# Patient Record
Sex: Male | Born: 1945 | Race: White | Hispanic: No | Marital: Single | Smoking: Current every day smoker
Health system: Southern US, Community
[De-identification: ages and names within clinical notes are randomized; demographics above are authoritative.]

## PROBLEM LIST (undated history)

## (undated) DIAGNOSIS — F329 Major depressive disorder, single episode, unspecified: Secondary | ICD-10-CM

## (undated) DIAGNOSIS — J449 Chronic obstructive pulmonary disease, unspecified: Secondary | ICD-10-CM

## (undated) DIAGNOSIS — E119 Type 2 diabetes mellitus without complications: Secondary | ICD-10-CM

## (undated) DIAGNOSIS — I2510 Atherosclerotic heart disease of native coronary artery without angina pectoris: Secondary | ICD-10-CM

## (undated) DIAGNOSIS — G4730 Sleep apnea, unspecified: Secondary | ICD-10-CM

## (undated) DIAGNOSIS — E785 Hyperlipidemia, unspecified: Secondary | ICD-10-CM

## (undated) DIAGNOSIS — F32A Depression, unspecified: Secondary | ICD-10-CM

## (undated) DIAGNOSIS — M1990 Unspecified osteoarthritis, unspecified site: Secondary | ICD-10-CM

## (undated) DIAGNOSIS — F1910 Other psychoactive substance abuse, uncomplicated: Secondary | ICD-10-CM

## (undated) HISTORY — PX: CORONARY ANGIOPLASTY: SHX604

## (undated) HISTORY — PX: CARDIAC CATHETERIZATION: SHX172

---

## 2008-09-10 ENCOUNTER — Emergency Department: Payer: Self-pay | Admitting: Emergency Medicine

## 2008-09-10 ENCOUNTER — Other Ambulatory Visit: Payer: Self-pay

## 2010-09-06 ENCOUNTER — Emergency Department: Payer: Self-pay | Admitting: Emergency Medicine

## 2014-04-24 ENCOUNTER — Emergency Department: Payer: Self-pay | Admitting: Emergency Medicine

## 2014-04-24 LAB — BASIC METABOLIC PANEL
Anion Gap: 4 — ABNORMAL LOW (ref 7–16)
BUN: 18 mg/dL (ref 7–18)
CHLORIDE: 105 mmol/L (ref 98–107)
CO2: 27 mmol/L (ref 21–32)
CREATININE: 1.23 mg/dL (ref 0.60–1.30)
Calcium, Total: 9.2 mg/dL (ref 8.5–10.1)
EGFR (African American): >60
EGFR (Non-African Amer.): 60 — ABNORMAL LOW
GLUCOSE: 132 mg/dL — AB (ref 65–99)
OSMOLALITY: 276 (ref 275–301)
POTASSIUM: 3.9 mmol/L (ref 3.5–5.1)
Sodium: 136 mmol/L (ref 136–145)

## 2014-04-24 LAB — CBC
HCT: 51.8 % (ref 40.0–52.0)
HGB: 17.6 g/dL (ref 13.0–18.0)
MCH: 31.2 pg (ref 26.0–34.0)
MCHC: 33.9 g/dL (ref 32.0–36.0)
MCV: 92 fL (ref 80–100)
Platelet: 286 10*3/uL (ref 150–440)
RBC: 5.64 10*6/uL (ref 4.40–5.90)
RDW: 13.8 % (ref 11.5–14.5)
WBC: 8.3 10*3/uL (ref 3.8–10.6)

## 2014-04-24 LAB — TROPONIN I: Troponin-I: <0.02 ng/mL

## 2015-07-06 ENCOUNTER — Ambulatory Visit: Payer: Self-pay | Admitting: *Deleted

## 2016-05-01 ENCOUNTER — Encounter: Payer: Self-pay | Admitting: *Deleted

## 2016-05-02 ENCOUNTER — Ambulatory Visit
Admission: RE | Admit: 2016-05-02 | Discharge: 2016-05-02 | Disposition: A | Discharge status: Home Health | Payer: Medicare HMO | Source: Ambulatory Visit | Attending: Gastroenterology | Admitting: Gastroenterology

## 2016-05-02 ENCOUNTER — Ambulatory Visit: Payer: Medicare HMO | Admitting: Anesthesiology

## 2016-05-02 ENCOUNTER — Encounter: Payer: Self-pay | Admitting: *Deleted

## 2016-05-02 ENCOUNTER — Encounter
Admission: RE | Disposition: A | Discharge status: Home Health | Payer: Self-pay | Source: Ambulatory Visit | Attending: Gastroenterology

## 2016-05-02 DIAGNOSIS — Z800 Family history of malignant neoplasm of digestive organs: Secondary | ICD-10-CM | POA: Diagnosis not present

## 2016-05-02 DIAGNOSIS — F329 Major depressive disorder, single episode, unspecified: Secondary | ICD-10-CM | POA: Insufficient documentation

## 2016-05-02 DIAGNOSIS — E119 Type 2 diabetes mellitus without complications: Secondary | ICD-10-CM | POA: Insufficient documentation

## 2016-05-02 DIAGNOSIS — G4730 Sleep apnea, unspecified: Secondary | ICD-10-CM | POA: Insufficient documentation

## 2016-05-02 DIAGNOSIS — Z5309 Procedure and treatment not carried out because of other contraindication: Secondary | ICD-10-CM | POA: Insufficient documentation

## 2016-05-02 DIAGNOSIS — K621 Rectal polyp: Secondary | ICD-10-CM | POA: Insufficient documentation

## 2016-05-02 DIAGNOSIS — F1020 Alcohol dependence, uncomplicated: Secondary | ICD-10-CM | POA: Diagnosis not present

## 2016-05-02 DIAGNOSIS — Z7984 Long term (current) use of oral hypoglycemic drugs: Secondary | ICD-10-CM | POA: Insufficient documentation

## 2016-05-02 DIAGNOSIS — Z79899 Other long term (current) drug therapy: Secondary | ICD-10-CM | POA: Insufficient documentation

## 2016-05-02 DIAGNOSIS — Z7982 Long term (current) use of aspirin: Secondary | ICD-10-CM | POA: Insufficient documentation

## 2016-05-02 DIAGNOSIS — E785 Hyperlipidemia, unspecified: Secondary | ICD-10-CM | POA: Insufficient documentation

## 2016-05-02 DIAGNOSIS — J449 Chronic obstructive pulmonary disease, unspecified: Secondary | ICD-10-CM | POA: Insufficient documentation

## 2016-05-02 DIAGNOSIS — Z1211 Encounter for screening for malignant neoplasm of colon: Secondary | ICD-10-CM | POA: Diagnosis not present

## 2016-05-02 DIAGNOSIS — I2510 Atherosclerotic heart disease of native coronary artery without angina pectoris: Secondary | ICD-10-CM | POA: Insufficient documentation

## 2016-05-02 DIAGNOSIS — M1990 Unspecified osteoarthritis, unspecified site: Secondary | ICD-10-CM | POA: Insufficient documentation

## 2016-05-02 HISTORY — DX: Atherosclerotic heart disease of native coronary artery without angina pectoris: I25.10

## 2016-05-02 HISTORY — DX: Chronic obstructive pulmonary disease, unspecified: J44.9

## 2016-05-02 HISTORY — DX: Major depressive disorder, single episode, unspecified: F32.9

## 2016-05-02 HISTORY — DX: Hyperlipidemia, unspecified: E78.5

## 2016-05-02 HISTORY — DX: Other psychoactive substance abuse, uncomplicated: F19.10

## 2016-05-02 HISTORY — PX: COLONOSCOPY WITH PROPOFOL: SHX5780

## 2016-05-02 HISTORY — DX: Unspecified osteoarthritis, unspecified site: M19.90

## 2016-05-02 HISTORY — DX: Depression, unspecified: F32.A

## 2016-05-02 HISTORY — DX: Sleep apnea, unspecified: G47.30

## 2016-05-02 HISTORY — DX: Type 2 diabetes mellitus without complications: E11.9

## 2016-05-02 SURGERY — COLONOSCOPY WITH PROPOFOL
Anesthesia: General

## 2016-05-02 MED ORDER — FENTANYL CITRATE (PF) 100 MCG/2ML IJ SOLN
INTRAMUSCULAR | Status: DC | PRN
  Administered 2016-05-02: 50 ug via INTRAVENOUS

## 2016-05-02 MED ORDER — SODIUM CHLORIDE 0.9 % IV SOLN
INTRAVENOUS | Status: DC
  Administered 2016-05-02: 12:00:00 via INTRAVENOUS

## 2016-05-02 MED ORDER — MIDAZOLAM HCL 2 MG/2ML IJ SOLN
INTRAMUSCULAR | Status: DC | PRN
Start: 2016-05-02 — End: 2016-05-02
  Administered 2016-05-02: 1 mg via INTRAVENOUS

## 2016-05-02 MED ORDER — PROPOFOL 500 MG/50ML IV EMUL
INTRAVENOUS | Status: DC | PRN
  Administered 2016-05-02: 120 ug/kg/min via INTRAVENOUS

## 2016-05-02 NOTE — Transfer of Care (Signed)
Immediate Anesthesia Transfer of Care Note  Patient: Ross French  Procedure(s) Performed: Procedure(s): COLONOSCOPY WITH PROPOFOL (N/A)  Patient Location: PACU  Anesthesia Type:General  Level of Consciousness: awake, alert  and oriented  Airway & Oxygen Therapy: Patient Spontanous Breathing and Patient connected to nasal cannula oxygen  Post-op Assessment: Report given to RN and Post -op Vital signs reviewed and stable  Post vital signs: Reviewed and stable  Last Vitals:  Filed Vitals:   05/02/16 1311 05/02/16 1354  BP: 126/89 114/71  Pulse: 68   Temp: 37 C 35.7 C  Resp: 20     Last Pain: There were no vitals filed for this visit.       Complications: No apparent anesthesia complications

## 2016-05-02 NOTE — Op Note (Signed)
[facility] Patient Name: Ross French Procedure Date: 05/02/2016 1:35 PM MRN: [id number] Account #: [id number] Date of Birth: Feb 29, 1946 Admit Type: Outpatient Age: 70 Room: [facility] ENDO ROOM 3 Gender: Male Note Status: Finalized Procedure:            Colonoscopy Indications:          Screening in patient at increased risk: Family history                        of 1st-degree relative with colorectal cancer before                        age 71 years, This is the patient's first colonoscopy Providers:            Lollie Sails, MD Referring MD:         No Local Md, MD (Referring MD) Medicines:            Monitored Anesthesia Care Complications:        No immediate complications. Procedure:            Pre-Anesthesia Assessment:                       - ASA Grade Assessment: III - A patient with severe                        systemic disease.                       After obtaining informed consent, the colonoscope was                        passed under direct vision. Throughout the procedure,                        the patient's blood pressure, pulse, and oxygen                        saturations were monitored continuously. The                        Colonoscope was introduced through the anus with the                        intention of advancing to the cecum. The scope was                        advanced to the sigmoid colon before the procedure was                        aborted. Medications were given. The patient tolerated                        the procedure well. The quality of the bowel                        preparation was poor. Findings:      A 3 mm polyp was found in the rectum. The polyp was sessile. The polyp       was removed with a cold biopsy forceps. Resection and retrieval were  complete.      I advanced the scope to about the mid sigmoid colon. There were several       polyps noted however there was a laerge amount of stool  and food residue       that obstructed vision , precluding adequate examination. Other polyps       were not removed due to inadequacy of the prep. Impression:           - Preparation of the colon was poor.                       - One 3 mm polyp in the rectum, removed with a cold                        biopsy forceps. Resected and retrieved. Recommendation:       - Discharge patient to home.                       - repeat prep and reschedule. Procedure Code(s):    --- Professional ---                       [phone], 23, Colonoscopy, flexible; with biopsy, single                        or multiple Diagnosis Code(s):    --- Professional ---                       Z80.0, Family history of malignant neoplasm of                        digestive organs                       K62.1, Rectal polyp CPT copyright 2016 [employer]. All rights reserved. The codes documented in this report are preliminary and upon coder review may  be revised to meet current compliance requirements. Lollie Sails, MD 05/02/2016 1:54:41 PM This report has been signed electronically. Number of Addenda: 0 Note Initiated On: 05/02/2016 1:35 PM Total Procedure Duration: 0 hours 7 minutes 32 seconds       [facility]

## 2016-05-02 NOTE — Anesthesia Procedure Notes (Signed)
Performed by: COOK-MARTIN, Prynce Jacober Pre-anesthesia Checklist: Patient identified, Emergency Drugs available, Suction available, Patient being monitored and Timeout performed Patient Re-evaluated:Patient Re-evaluated prior to inductionOxygen Delivery Method: Nasal cannula Preoxygenation: Pre-oxygenation with 100% oxygen Intubation Type: IV induction Placement Confirmation: positive ETCO2 and CO2 detector       

## 2016-05-02 NOTE — Anesthesia Preprocedure Evaluation (Signed)
Anesthesia Evaluation  Patient identified by MRN, date of birth, ID band Patient awake    Reviewed: Allergy & Precautions, H&P , NPO status , Patient's Chart, lab work & pertinent test results, reviewed documented beta blocker date and time   History of Anesthesia Complications Negative for: history of anesthetic complications  Airway Mallampati: II  TM Distance: >3 FB Neck ROM: full    Dental no notable dental hx. (+) Missing, Poor Dentition   Pulmonary neg shortness of breath, sleep apnea , COPD, neg recent URI, Current Smoker,    Pulmonary exam normal breath sounds clear to auscultation       Cardiovascular Exercise Tolerance: Good (-) hypertension(-) angina+ CAD and + Cardiac Stents (x3)  (-) Past MI and (-) CABG Normal cardiovascular exam(-) dysrhythmias (-) Valvular Problems/Murmurs Rhythm:regular Rate:Normal     Neuro/Psych PSYCHIATRIC DISORDERS (Depression) negative neurological ROS     GI/Hepatic negative GI ROS, Neg liver ROS,   Endo/Other  diabetes  Renal/GU negative Renal ROS  negative genitourinary   Musculoskeletal   Abdominal   Peds  Hematology negative hematology ROS (+)   Anesthesia Other Findings Past Medical History:   Arthritis                                                    Coronary artery disease                                      COPD (chronic obstructive pulmonary disease) (*              Depression                                                   Hyperlipidemia                                               Sleep apnea                                                  Diabetes mellitus without complication (HCC)                 Substance abuse                                 ETOH Addi*   Reproductive/Obstetrics negative OB ROS                             Anesthesia Physical Anesthesia Plan  ASA: III  Anesthesia Plan: General   Post-op Pain Management:     Induction:   Airway Management Planned:   Additional Equipment:   Intra-op Plan:   Post-operative Plan:   Informed Consent: I have reviewed the patients History and Physical,  chart, labs and discussed the procedure including the risks, benefits and alternatives for the proposed anesthesia with the patient or authorized representative who has indicated his/her understanding and acceptance.   Dental Advisory Given  Plan Discussed with: Anesthesiologist, CRNA and Surgeon  Anesthesia Plan Comments:         Anesthesia Quick Evaluation

## 2016-05-02 NOTE — H&P (Signed)
Outpatient short stay form Pre-procedure 05/02/2016 1:18 PM Lollie Sails MD  Primary Physician: Dr. Kirkland Hun  Reason for visit:  Colonoscopy  History of present illness:  Patient is a 70 year old male with a family history of colon cancer in a primary relative. He is presenting today for colonoscopy. He does take both Plavix and a baby aspirin daily however stopped those last Wednesday. He denies any other aspirin products or blood thinning agents. He tolerated his prep well however did eat some solid foods yesterday. There is a family history of colon cancer in primary relatives, father.    Current facility-administered medications:  .  0.9 %  sodium chloride infusion, , Intravenous, Continuous, Lollie Sails, MD  Prescriptions prior to admission  Medication Sig Dispense Refill Last Dose  . aspirin EC 81 MG tablet Take 81 mg by mouth daily.   Past Week at Unknown time  . atorvastatin (LIPITOR) 80 MG tablet Take 80 mg by mouth daily.   05/01/2016 at Unknown time  . Cholecalciferol (VITAMIN D-3 PO) Take 2,000 Units by mouth daily.   05/01/2016 at Unknown time  . clopidogrel (PLAVIX) 75 MG tablet Take 75 mg by mouth daily.   Past Week at Unknown time  . Ipratropium-Albuterol (COMBIVENT) 20-100 MCG/ACT AERS respimat Inhale 1 puff into the lungs every 4 (four) hours as needed for wheezing.   05/01/2016 at Unknown time  . Levomefolate Calcium POWD 1 tablet by Does not apply route daily.   05/01/2016 at Unknown time  . metFORMIN (GLUCOPHAGE-XR) 500 MG 24 hr tablet Take 500 mg by mouth daily with supper.   05/01/2016 at Unknown time  . metoprolol succinate (TOPROL-XL) 25 MG 24 hr tablet Take 25 mg by mouth daily.   05/02/2016 at 0630  . PARoxetine (PAXIL) 20 MG tablet Take 20 mg by mouth daily.   05/02/2016 at 0630  . sildenafil (VIAGRA) 25 MG tablet Take 25 mg by mouth daily as needed for erectile dysfunction.   Past Week at Unknown time     No Known Allergies   Past Medical History   Diagnosis Date  . Arthritis   . Coronary artery disease   . COPD (chronic obstructive pulmonary disease) ([place])   . Depression   . Hyperlipidemia   . Sleep apnea   . Diabetes mellitus without complication ([place])   . Substance abuse ETOH Addiction    Review of systems:      Physical Exam    Heart and lungs: Regular rate and rhythm without rub or gallop, lungs are bilaterally clear.    HEENT: Normocephalic atraumatic eyes are anicteric    Other:     Pertinant exam for procedure: Soft nontender nondistended bowel sounds positive normoactive.    Planned proceedures: Colonoscopy and indicated procedures. I have discussed the risks benefits and complications of procedures to include not limited to bleeding, infection, perforation and the risk of sedation and the patient wishes to proceed.    Lollie Sails, MD Gastroenterology 05/02/2016  1:18 PM

## 2016-05-03 ENCOUNTER — Ambulatory Visit
Admission: RE | Admit: 2016-05-03 | Discharge: 2016-05-03 | Disposition: A | Discharge status: Home Health | Payer: Medicare HMO | Source: Ambulatory Visit | Attending: Gastroenterology | Admitting: Gastroenterology

## 2016-05-03 ENCOUNTER — Encounter
Admission: RE | Disposition: A | Discharge status: Home Health | Payer: Self-pay | Source: Ambulatory Visit | Attending: Gastroenterology

## 2016-05-03 ENCOUNTER — Encounter: Payer: Self-pay | Admitting: Gastroenterology

## 2016-05-03 ENCOUNTER — Ambulatory Visit: Payer: Medicare HMO | Admitting: Anesthesiology

## 2016-05-03 DIAGNOSIS — K621 Rectal polyp: Secondary | ICD-10-CM | POA: Diagnosis not present

## 2016-05-03 DIAGNOSIS — D125 Benign neoplasm of sigmoid colon: Secondary | ICD-10-CM | POA: Insufficient documentation

## 2016-05-03 DIAGNOSIS — F17210 Nicotine dependence, cigarettes, uncomplicated: Secondary | ICD-10-CM | POA: Diagnosis not present

## 2016-05-03 DIAGNOSIS — E785 Hyperlipidemia, unspecified: Secondary | ICD-10-CM | POA: Diagnosis not present

## 2016-05-03 DIAGNOSIS — Z800 Family history of malignant neoplasm of digestive organs: Secondary | ICD-10-CM | POA: Diagnosis not present

## 2016-05-03 DIAGNOSIS — Z7982 Long term (current) use of aspirin: Secondary | ICD-10-CM | POA: Diagnosis not present

## 2016-05-03 DIAGNOSIS — E119 Type 2 diabetes mellitus without complications: Secondary | ICD-10-CM | POA: Insufficient documentation

## 2016-05-03 DIAGNOSIS — I2510 Atherosclerotic heart disease of native coronary artery without angina pectoris: Secondary | ICD-10-CM | POA: Diagnosis not present

## 2016-05-03 DIAGNOSIS — J449 Chronic obstructive pulmonary disease, unspecified: Secondary | ICD-10-CM | POA: Diagnosis not present

## 2016-05-03 DIAGNOSIS — Z79899 Other long term (current) drug therapy: Secondary | ICD-10-CM | POA: Diagnosis not present

## 2016-05-03 DIAGNOSIS — Z7984 Long term (current) use of oral hypoglycemic drugs: Secondary | ICD-10-CM | POA: Insufficient documentation

## 2016-05-03 DIAGNOSIS — D123 Benign neoplasm of transverse colon: Secondary | ICD-10-CM | POA: Insufficient documentation

## 2016-05-03 DIAGNOSIS — G4730 Sleep apnea, unspecified: Secondary | ICD-10-CM | POA: Diagnosis not present

## 2016-05-03 DIAGNOSIS — D120 Benign neoplasm of cecum: Secondary | ICD-10-CM | POA: Insufficient documentation

## 2016-05-03 DIAGNOSIS — K5730 Diverticulosis of large intestine without perforation or abscess without bleeding: Secondary | ICD-10-CM | POA: Diagnosis not present

## 2016-05-03 DIAGNOSIS — M1990 Unspecified osteoarthritis, unspecified site: Secondary | ICD-10-CM | POA: Diagnosis not present

## 2016-05-03 DIAGNOSIS — D124 Benign neoplasm of descending colon: Secondary | ICD-10-CM | POA: Diagnosis not present

## 2016-05-03 DIAGNOSIS — F329 Major depressive disorder, single episode, unspecified: Secondary | ICD-10-CM | POA: Diagnosis not present

## 2016-05-03 DIAGNOSIS — Z1211 Encounter for screening for malignant neoplasm of colon: Secondary | ICD-10-CM | POA: Diagnosis present

## 2016-05-03 HISTORY — PX: COLONOSCOPY WITH PROPOFOL: SHX5780

## 2016-05-03 LAB — SURGICAL PATHOLOGY

## 2016-05-03 LAB — GLUCOSE, CAPILLARY: Glucose-Capillary: 112 mg/dL — ABNORMAL HIGH (ref 65–99)

## 2016-05-03 SURGERY — COLONOSCOPY WITH PROPOFOL
Anesthesia: General

## 2016-05-03 MED ORDER — PROPOFOL 500 MG/50ML IV EMUL
INTRAVENOUS | Status: DC | PRN
  Administered 2016-05-03: 120 ug/kg/min via INTRAVENOUS

## 2016-05-03 MED ORDER — SODIUM CHLORIDE 0.9 % IV SOLN: INTRAVENOUS | Status: DC

## 2016-05-03 MED ORDER — SODIUM CHLORIDE 0.9 % IV SOLN
INTRAVENOUS | Status: DC
Start: 2016-05-03 — End: 2016-05-03
  Administered 2016-05-03: 14:00:00 via INTRAVENOUS

## 2016-05-03 MED ORDER — PROPOFOL 10 MG/ML IV BOLUS
INTRAVENOUS | Status: DC | PRN
  Administered 2016-05-03 (×2): 10 mg via INTRAVENOUS
  Administered 2016-05-03: 20 mg via INTRAVENOUS

## 2016-05-03 MED ORDER — EPHEDRINE SULFATE 50 MG/ML IJ SOLN
INTRAMUSCULAR | Status: DC | PRN
  Administered 2016-05-03 (×2): 10 mg via INTRAVENOUS

## 2016-05-03 MED ORDER — FENTANYL CITRATE (PF) 100 MCG/2ML IJ SOLN
INTRAMUSCULAR | Status: DC | PRN
  Administered 2016-05-03: 50 ug via INTRAVENOUS

## 2016-05-03 MED ORDER — MIDAZOLAM HCL 2 MG/2ML IJ SOLN
INTRAMUSCULAR | Status: DC | PRN
  Administered 2016-05-03: 1 mg via INTRAVENOUS

## 2016-05-03 NOTE — Op Note (Signed)
[facility] Patient Name: Ross French Procedure Date: 05/03/2016 2:19 PM MRN: [id number] Account #: [id number] Date of Birth: [birth year] Admit Type: Outpatient Age: 70 Room: [facility] ENDO ROOM 4 Gender: Male Note Status: Finalized Procedure:            Colonoscopy Indications:          This is the patient's first colonoscopy, Family history                        of colon cancer in a first-degree relative Providers:            Lollie Sails, MD Referring MD:         No Local Md, MD (Referring MD) Medicines:            Monitored Anesthesia Care Complications:        No immediate complications. Procedure:            Pre-Anesthesia Assessment:                       - ASA Grade Assessment: III - A patient with severe                        systemic disease.                       After obtaining informed consent, the colonoscope was                        passed under direct vision. Throughout the procedure,                        the patient's blood pressure, pulse, and oxygen                        saturations were monitored continuously. The                        Colonoscope was introduced through the anus and                        advanced to the the cecum, identified by appendiceal                        orifice and ileocecal valve. The colonoscopy was                        unusually difficult due to a tortuous colon. Successful                        completion of the procedure was aided by changing the                        patient to a supine position and using manual pressure.                        The patient tolerated the procedure well. The quality                        of the bowel preparation was fair. Findings:      A 10 mm  polyp was found in the cecum. The polyp was multi-lobulated and       sessile. The polyp was removed with a cold snare. Resection and       retrieval were complete. To prevent bleeding after the polypectomy,  one       hemostatic clip was successfully placed. There was no bleeding at the       end of the maneuver.      A 1 mm polyp was found in the cecum. The polyp was sessile. The polyp       was removed with a cold biopsy forceps. Resection and retrieval were       complete.      A 5 mm polyp was found in the hepatic flexure. The polyp was sessile.       The polyp was removed with a cold snare. Resection and retrieval were       complete.      A 5 mm polyp was found in the proximal transverse colon. The polyp was       sessile. The polyp was removed with a cold snare. Resection and       retrieval were complete.      A 16 mm polyp was found in the mid transverse colon. The polyp was flat.       The polyp was removed with a cold snare. Resection and retrieval were       complete. To prevent bleeding after the polypectomy, one hemostatic clip       was successfully placed. There was no bleeding at the end of the       maneuver.      A 4 mm polyp was found in the distal transverse colon. The polyp was       sessile. The polyp was removed with a cold snare. Resection and       retrieval were complete.      A 2 mm polyp was found in the descending colon. The polyp was sessile.       The polyp was removed with a cold biopsy forceps. Resection and       retrieval were complete.      A 4 mm polyp was found in the mid sigmoid colon. The polyp was sessile.       The polyp was removed with a cold snare. Resection and retrieval were       complete.      A 14 mm polyp was found in the distal sigmoid colon. The polyp was       pedunculated. The polyp was removed with a hot snare. Resection and       retrieval were complete. To prevent bleeding after the polypectomy, one       hemostatic clip was successfully placed. There was no bleeding at the       end of the maneuver.      A 2 mm polyp was found in the rectum. The polyp was sessile. The polyp       was removed with a cold biopsy forceps. Resection  and retrieval were       complete.      The retroflexed view of the distal rectum and anal verge was normal and       showed no anal or rectal abnormalities.      Multiple small and large-mouthed diverticula were found in the sigmoid       colon, descending colon, transverse colon and  ascending colon.      The digital rectal exam was normal. Impression:           - Preparation of the colon was fair.                       - One 10 mm polyp in the cecum, removed with a cold                        snare. Resected and retrieved. Clip was placed.                       - One 1 mm polyp in the cecum, removed with a cold                        biopsy forceps. Resected and retrieved.                       - One 5 mm polyp at the hepatic flexure, removed with a                        cold snare. Resected and retrieved.                       - One 5 mm polyp in the proximal transverse colon,                        removed with a cold snare. Resected and retrieved.                       - One 16 mm polyp in the mid transverse colon, removed                        with a cold snare. Resected and retrieved. Clip was                        placed.                       - One 4 mm polyp in the distal transverse colon,                        removed with a cold snare. Resected and retrieved.                       - One 2 mm polyp in the descending colon, removed with                        a cold biopsy forceps. Resected and retrieved.                       - One 4 mm polyp in the mid sigmoid colon, removed with                        a cold snare. Resected and retrieved.                       - One 14 mm polyp in the distal sigmoid colon, removed  with a hot snare. Resected and retrieved. Clip was                        placed.                       - One 2 mm polyp in the rectum, removed with a cold                        biopsy forceps. Resected and retrieved.                        - The distal rectum and anal verge are normal on                        retroflexion view.                       - Diverticulosis in the sigmoid colon, in the                        descending colon, in the transverse colon and in the                        ascending colon. Recommendation:       - Discharge patient to home.                       - Clear liquid diet today.                       - Full liquid diet for 2 days, then advance as                        tolerated to low fiber diet for 5 days.                       - Await pathology results.                       - Telephone GI clinic for pathology results in 1 week. Procedure Code(s):    --- Professional ---                       [phone], Colonoscopy, flexible; with removal of tumor(s),                        polyp(s), or other lesion(s) by snare technique                       [id number], 17, Colonoscopy, flexible; with biopsy, single                        or multiple Diagnosis Code(s):    --- Professional ---                       D12.0, Benign neoplasm of cecum                       D12.3, Benign neoplasm of transverse colon (hepatic  flexure or splenic flexure)                       D12.4, Benign neoplasm of descending colon                       D12.5, Benign neoplasm of sigmoid colon                       K62.1, Rectal polyp                       Z80.0, Family history of malignant neoplasm of                        digestive organs                       K57.30, Diverticulosis of large intestine without                        perforation or abscess without bleeding CPT copyright 2016 American Medical Association. All rights reserved. The codes documented in this report are preliminary and upon coder review may  be revised to meet current compliance requirements. Lollie Sails, MD 05/03/2016 3:52:19 PM This report has been signed electronically. Number of Addenda: 0 Note Initiated On: 05/03/2016  2:19 PM Scope Withdrawal Time: 1 hour 2 minutes 47 seconds  Total Procedure Duration: 1 hour 12 minutes 52 seconds       [facility]

## 2016-05-03 NOTE — Anesthesia Procedure Notes (Signed)
Performed by: COOK-MARTIN, Falana Clagg Pre-anesthesia Checklist: Patient identified, Emergency Drugs available, Suction available, Patient being monitored and Timeout performed Patient Re-evaluated:Patient Re-evaluated prior to inductionOxygen Delivery Method: Nasal cannula Preoxygenation: Pre-oxygenation with 100% oxygen Intubation Type: IV induction Placement Confirmation: positive ETCO2 and CO2 detector       

## 2016-05-03 NOTE — Anesthesia Preprocedure Evaluation (Signed)
Anesthesia Evaluation  Patient identified by MRN, date of birth, ID band Patient awake    Reviewed: Allergy & Precautions, H&P , NPO status , Patient's Chart, lab work & pertinent test results, reviewed documented beta blocker date and time   History of Anesthesia Complications Negative for: history of anesthetic complications  Airway Mallampati: II  TM Distance: >3 FB Neck ROM: full    Dental no notable dental hx. (+) Missing, Poor Dentition   Pulmonary neg shortness of breath, sleep apnea , COPD, neg recent URI, Current Smoker,    Pulmonary exam normal breath sounds clear to auscultation       Cardiovascular Exercise Tolerance: Good (-) hypertension(-) angina+ CAD and + Cardiac Stents (x3)  (-) Past MI and (-) CABG Normal cardiovascular exam(-) dysrhythmias (-) Valvular Problems/Murmurs Rhythm:regular Rate:Normal     Neuro/Psych PSYCHIATRIC DISORDERS (Depression) negative neurological ROS     GI/Hepatic negative GI ROS, Neg liver ROS,   Endo/Other  diabetes  Renal/GU negative Renal ROS  negative genitourinary   Musculoskeletal   Abdominal   Peds  Hematology negative hematology ROS (+)   Anesthesia Other Findings Past Medical History:   Arthritis                                                    Coronary artery disease                                      COPD (chronic obstructive pulmonary disease) (*              Depression                                                   Hyperlipidemia                                               Sleep apnea                                                  Diabetes mellitus without complication (HCC)                 Substance abuse                                 ETOH Addi*   Reproductive/Obstetrics negative OB ROS                             Anesthesia Physical  Anesthesia Plan  ASA: III  Anesthesia Plan: General   Post-op Pain  Management:    Induction:   Airway Management Planned:   Additional Equipment:   Intra-op Plan:   Post-operative Plan:   Informed Consent: I have reviewed the patients History and  Physical, chart, labs and discussed the procedure including the risks, benefits and alternatives for the proposed anesthesia with the patient or authorized representative who has indicated his/her understanding and acceptance.   Dental Advisory Given  Plan Discussed with: Anesthesiologist, CRNA and Surgeon  Anesthesia Plan Comments:         Anesthesia Quick Evaluation

## 2016-05-03 NOTE — H&P (Signed)
Outpatient short stay form Pre-procedure 05/03/2016 2:03 PM Lollie Sails MD  Primary Physician: Dr. Kirkland Hun  Reason for visit:  Colonoscopy  History of present illness:  Patient is a 70 year old male with a family history of colon cancer in primary relative. He presented yesterday for colonoscopy however his prep was not adequate. Her some polyps seen. Return today after repeat prep. He does take Plavix and a baby aspirin however is helping those since last Wednesday. He tolerated the repeat prep well.    Current facility-administered medications:  .  0.9 %  sodium chloride infusion, , Intravenous, Continuous, Lollie Sails, MD .  0.9 %  sodium chloride infusion, , Intravenous, Continuous, Lollie Sails, MD  Prescriptions prior to admission  Medication Sig Dispense Refill Last Dose  . aspirin EC 81 MG tablet Take 81 mg by mouth daily.   Past Week at Unknown time  . atorvastatin (LIPITOR) 80 MG tablet Take 80 mg by mouth daily.   Past Week at Unknown time  . Cholecalciferol (VITAMIN D-3 PO) Take 2,000 Units by mouth daily.   Past Week at Unknown time  . clopidogrel (PLAVIX) 75 MG tablet Take 75 mg by mouth daily.   Past Week at Unknown time  . Ipratropium-Albuterol (COMBIVENT) 20-100 MCG/ACT AERS respimat Inhale 1 puff into the lungs every 4 (four) hours as needed for wheezing.   Past Week at Unknown time  . metFORMIN (GLUCOPHAGE-XR) 500 MG 24 hr tablet Take 500 mg by mouth daily with supper.   Past Week at Unknown time  . metoprolol succinate (TOPROL-XL) 25 MG 24 hr tablet Take 25 mg by mouth daily.   05/03/2016 at 0630  . PARoxetine (PAXIL) 20 MG tablet Take 20 mg by mouth daily.   05/03/2016 at 0630  . Levomefolate Calcium POWD 1 tablet by Does not apply route daily.   05/01/2016 at Unknown time  . sildenafil (VIAGRA) 25 MG tablet Take 25 mg by mouth daily as needed for erectile dysfunction.   Past Week at Unknown time     No Known Allergies   Past Medical History   Diagnosis Date  . Arthritis   . Coronary artery disease   . COPD (chronic obstructive pulmonary disease) ([place])   . Depression   . Hyperlipidemia   . Sleep apnea   . Diabetes mellitus without complication ([place])   . Substance abuse ETOH Addiction    Review of systems:      Physical Exam    Heart and lungs: Regular rate and rhythm without rub or gallop, lungs are bilaterally clear.    HEENT: Normocephalic atraumatic eyes are anicteric    Other:     Pertinant exam for procedure: Soft nontender nondistended bowel sounds positive normoactive.   Planned proceedures: Colonoscopy and indicated procedures. I have discussed the risks benefits and complications of procedures to include not limited to bleeding, infection, perforation and the risk of sedation and the patient wishes to proceed.    Lollie Sails, MD Gastroenterology 05/03/2016  2:03 PM

## 2016-05-03 NOTE — Transfer of Care (Signed)
Immediate Anesthesia Transfer of Care Note  Patient: Ross French  Procedure(s) Performed: Procedure(s): COLONOSCOPY WITH PROPOFOL (N/A)  Patient Location: PACU  Anesthesia Type:General  Level of Consciousness: awake and alert   Airway & Oxygen Therapy: Patient Spontanous Breathing and Patient connected to nasal cannula oxygen  Post-op Assessment: Report given to RN and Post -op Vital signs reviewed and stable  Post vital signs: Reviewed and stable  Last Vitals:  Filed Vitals:   05/03/16 1350  BP: 118/72  Pulse: 55  Temp: 36.3 C  Resp: 14    Last Pain: There were no vitals filed for this visit.       Complications: No apparent anesthesia complications

## 2016-05-04 ENCOUNTER — Encounter: Payer: Self-pay | Admitting: Gastroenterology

## 2016-05-04 NOTE — Anesthesia Postprocedure Evaluation (Signed)
Anesthesia Post Note  Patient: Ross French  Procedure(s) Performed: Procedure(s) (LRB): COLONOSCOPY WITH PROPOFOL (N/A)  Patient location during evaluation: Endoscopy Anesthesia Type: General Level of consciousness: awake and alert Pain management: pain level controlled Vital Signs Assessment: post-procedure vital signs reviewed and stable Respiratory status: spontaneous breathing, nonlabored ventilation, respiratory function stable and patient connected to nasal cannula oxygen Cardiovascular status: blood pressure returned to baseline and stable Postop Assessment: no signs of nausea or vomiting Anesthetic complications: no    Last Vitals:  Filed Vitals:   05/03/16 1620 05/03/16 1628  BP: 139/80 130/85  Pulse: 47 46  Temp:    Resp: 17 24    Last Pain: There were no vitals filed for this visit.               Martha Clan

## 2016-05-04 NOTE — Anesthesia Postprocedure Evaluation (Signed)
Anesthesia Post Note  Patient: Ross French  Procedure(s) Performed: Procedure(s) (LRB): COLONOSCOPY WITH PROPOFOL (N/A)  Patient location during evaluation: Endoscopy Anesthesia Type: General Level of consciousness: awake and alert Pain management: pain level controlled Vital Signs Assessment: post-procedure vital signs reviewed and stable Respiratory status: spontaneous breathing, nonlabored ventilation, respiratory function stable and patient connected to nasal cannula oxygen Cardiovascular status: blood pressure returned to baseline and stable Postop Assessment: no signs of nausea or vomiting Anesthetic complications: no    Last Vitals:  Filed Vitals:   05/02/16 1413 05/02/16 1423  BP: 113/82 129/78  Pulse: 50 59  Temp:    Resp: 15 16    Last Pain: There were no vitals filed for this visit.               Martha Clan

## 2016-05-05 LAB — SURGICAL PATHOLOGY

## 2016-08-26 ENCOUNTER — Emergency Department
Admission: EM | Admit: 2016-08-26 | Discharge: 2016-08-26 | Disposition: A | Discharge status: Home / Self Care | Payer: Medicare HMO | Attending: Emergency Medicine | Admitting: Emergency Medicine

## 2016-08-26 ENCOUNTER — Emergency Department: Payer: Medicare HMO

## 2016-08-26 ENCOUNTER — Encounter: Payer: Self-pay | Admitting: Emergency Medicine

## 2016-08-26 DIAGNOSIS — Y929 Unspecified place or not applicable: Secondary | ICD-10-CM | POA: Insufficient documentation

## 2016-08-26 DIAGNOSIS — J449 Chronic obstructive pulmonary disease, unspecified: Secondary | ICD-10-CM | POA: Diagnosis not present

## 2016-08-26 DIAGNOSIS — F17210 Nicotine dependence, cigarettes, uncomplicated: Secondary | ICD-10-CM | POA: Diagnosis not present

## 2016-08-26 DIAGNOSIS — R51 Headache: Secondary | ICD-10-CM | POA: Insufficient documentation

## 2016-08-26 DIAGNOSIS — T1490XA Injury, unspecified, initial encounter: Secondary | ICD-10-CM

## 2016-08-26 DIAGNOSIS — E119 Type 2 diabetes mellitus without complications: Secondary | ICD-10-CM | POA: Diagnosis not present

## 2016-08-26 DIAGNOSIS — S20211A Contusion of right front wall of thorax, initial encounter: Secondary | ICD-10-CM | POA: Insufficient documentation

## 2016-08-26 DIAGNOSIS — Z7984 Long term (current) use of oral hypoglycemic drugs: Secondary | ICD-10-CM | POA: Insufficient documentation

## 2016-08-26 DIAGNOSIS — R110 Nausea: Secondary | ICD-10-CM | POA: Insufficient documentation

## 2016-08-26 DIAGNOSIS — S299XXA Unspecified injury of thorax, initial encounter: Secondary | ICD-10-CM | POA: Diagnosis present

## 2016-08-26 DIAGNOSIS — W1781XA Fall down embankment (hill), initial encounter: Secondary | ICD-10-CM | POA: Insufficient documentation

## 2016-08-26 DIAGNOSIS — Y939 Activity, unspecified: Secondary | ICD-10-CM | POA: Insufficient documentation

## 2016-08-26 DIAGNOSIS — R1011 Right upper quadrant pain: Secondary | ICD-10-CM | POA: Insufficient documentation

## 2016-08-26 DIAGNOSIS — Z7982 Long term (current) use of aspirin: Secondary | ICD-10-CM | POA: Diagnosis not present

## 2016-08-26 DIAGNOSIS — I2510 Atherosclerotic heart disease of native coronary artery without angina pectoris: Secondary | ICD-10-CM | POA: Diagnosis not present

## 2016-08-26 DIAGNOSIS — Y999 Unspecified external cause status: Secondary | ICD-10-CM | POA: Diagnosis not present

## 2016-08-26 LAB — CBC
HCT: 49.4 % (ref 40.0–52.0)
HEMOGLOBIN: 16.7 g/dL (ref 13.0–18.0)
MCH: 31 pg (ref 26.0–34.0)
MCHC: 33.9 g/dL (ref 32.0–36.0)
MCV: 91.7 fL (ref 80.0–100.0)
Platelets: 244 10*3/uL (ref 150–440)
RBC: 5.38 MIL/uL (ref 4.40–5.90)
RDW: 14.4 % (ref 11.5–14.5)
WBC: 8.8 10*3/uL (ref 3.8–10.6)

## 2016-08-26 LAB — COMPREHENSIVE METABOLIC PANEL
ALT: 26 U/L (ref 17–63)
ANION GAP: 7 (ref 5–15)
AST: 29 U/L (ref 15–41)
Albumin: 4 g/dL (ref 3.5–5.0)
Alkaline Phosphatase: 80 U/L (ref 38–126)
BILIRUBIN TOTAL: 1 mg/dL (ref 0.3–1.2)
BUN: 17 mg/dL (ref 6–20)
CO2: 24 mmol/L (ref 22–32)
Calcium: 9.5 mg/dL (ref 8.9–10.3)
Chloride: 107 mmol/L (ref 101–111)
Creatinine, Ser: 0.95 mg/dL (ref 0.61–1.24)
GFR calc Af Amer: >60 mL/min (ref >60)
Glucose, Bld: 144 mg/dL — ABNORMAL HIGH (ref 65–99)
POTASSIUM: 4.3 mmol/L (ref 3.5–5.1)
Sodium: 138 mmol/L (ref 135–145)
TOTAL PROTEIN: 7.1 g/dL (ref 6.5–8.1)

## 2016-08-26 LAB — TROPONIN I

## 2016-08-26 MED ORDER — HYDROCODONE-ACETAMINOPHEN 5-325 MG PO TABS
1.0000 | ORAL_TABLET | Freq: Once | ORAL | Status: AC
  Administered 2016-08-26: 1 via ORAL
  Filled 2016-08-26: qty 1

## 2016-08-26 MED ORDER — IOPAMIDOL (ISOVUE-300) INJECTION 61%
100.0000 mL | Freq: Once | INTRAVENOUS | Status: AC | PRN
  Administered 2016-08-26: 100 mL via INTRAVENOUS

## 2016-08-26 MED ORDER — SODIUM CHLORIDE 0.9 % IV BOLUS (SEPSIS)
500.0000 mL | Freq: Once | INTRAVENOUS | Status: AC
  Administered 2016-08-26: 500 mL via INTRAVENOUS

## 2016-08-26 MED ORDER — HYDROCODONE-ACETAMINOPHEN 5-325 MG PO TABS
ORAL_TABLET | ORAL | Status: AC
  Administered 2016-08-26: 1 via ORAL
  Filled 2016-08-26: qty 1

## 2016-08-26 MED ORDER — HYDROCODONE-ACETAMINOPHEN 5-325 MG PO TABS: 1.0000 | ORAL_TABLET | Freq: Four times a day (QID) | ORAL | 0 refills | Status: DC | PRN

## 2016-08-26 MED ORDER — ONDANSETRON 4 MG PO TBDP: 4.0000 mg | ORAL_TABLET | Freq: Four times a day (QID) | ORAL | 0 refills | Status: DC | PRN

## 2016-08-26 MED ORDER — ONDANSETRON 4 MG PO TBDP
4.0000 mg | ORAL_TABLET | Freq: Once | ORAL | Status: AC
  Administered 2016-08-26: 4 mg via ORAL
  Filled 2016-08-26: qty 1

## 2016-08-26 MED ORDER — HYDROCODONE-ACETAMINOPHEN 5-325 MG PO TABS
1.0000 | ORAL_TABLET | Freq: Once | ORAL | Status: AC
  Administered 2016-08-26: 1 via ORAL

## 2016-08-26 NOTE — Discharge Instructions (Signed)
°  Please follow up with your primary care doctor as soon as possible regarding today's ED visit and your recent accident.  Call your doctor or return to the Emergency Department (ED)  if you develop a sudden or severe headache, confusion, slurred speech, have abdominal pain, return of vomiting, facial droop, weakness or numbness in any arm or leg,  extreme fatigue, vomiting more than two times, severe abdominal pain, or other symptoms that concern you.

## 2016-08-26 NOTE — ED Provider Notes (Addendum)
[facility] Emergency Department Provider Note  ____________________________________________   First MD Initiated Contact with Patient 08/26/16 1113     (approximate)  I have reviewed the triage vital signs and the nursing notes.   HISTORY  Chief Complaint Fall; Back Pain; Headache; and Vomiting  HPI YEE COLLINGSWORTH is a 70 y.o. male   Patient reports that he was out hiking on Thursday, he lost his footing and fell down the side of an approximately 9-10 foot high embankment. He landed bluntly on his rigth side, and he believes he may have lost consciousness for a few seconds. Since that time he's been having persistent pain over his right lower chest and also in the right upper abdomen, reporting that he feels like he has "cracked ribs". He's been working through the pain at home, but reports that is not improving. No shortness of breath, has had some mild nausea and did vomit a couple of times the day of the injury which was nonbloody, reports he thought it might obtain because he is trying take ibuprofen on an empty stomach. He has not thrown up today, but did report poor appetite though he was able to attempt eating a hamburger yesterday.   Past Medical History:  Diagnosis Date  . Arthritis   . COPD (chronic obstructive pulmonary disease) ([place])   . Coronary artery disease   . Depression   . Diabetes mellitus without complication ([place])   . Hyperlipidemia   . Sleep apnea   . Substance abuse ETOH Addiction    There are no active problems to display for this patient.   Past Surgical History:  Procedure Laterality Date  . CARDIAC CATHETERIZATION    . COLONOSCOPY WITH PROPOFOL N/A 05/02/2016   Procedure: COLONOSCOPY WITH PROPOFOL;  Surgeon: Lollie Sails, MD;  Location: [facility] ENDOSCOPY;  Service: Endoscopy;  Laterality: N/A;  . COLONOSCOPY WITH PROPOFOL N/A 05/03/2016   Procedure: COLONOSCOPY WITH PROPOFOL;  Surgeon: Lollie Sails, MD;  Location:  [facility] ENDOSCOPY;  Service: Endoscopy;  Laterality: N/A;  . CORONARY ANGIOPLASTY      Prior to Admission medications   Medication Sig Start Date End Date Taking? Authorizing Provider  aspirin EC 81 MG tablet Take 81 mg by mouth daily.    Historical Provider, MD  atorvastatin (LIPITOR) 80 MG tablet Take 80 mg by mouth daily.    Historical Provider, MD  Cholecalciferol (VITAMIN D-3 PO) Take 2,000 Units by mouth daily.    Historical Provider, MD  clopidogrel (PLAVIX) 75 MG tablet Take 75 mg by mouth daily.    Historical Provider, MD  HYDROcodone-acetaminophen (NORCO/VICODIN) 5-325 MG tablet Take 1 tablet by mouth every 6 (six) hours as needed for moderate pain. 08/26/16   Delman Kitten, MD  Ipratropium-Albuterol (COMBIVENT) 20-100 MCG/ACT AERS respimat Inhale 1 puff into the lungs every 4 (four) hours as needed for wheezing.    Historical Provider, MD  Levomefolate Calcium POWD 1 tablet by Does not apply route daily.    Historical Provider, MD  metFORMIN (GLUCOPHAGE-XR) 500 MG 24 hr tablet Take 500 mg by mouth daily with supper.    Historical Provider, MD  metoprolol succinate (TOPROL-XL) 25 MG 24 hr tablet Take 25 mg by mouth daily.    Historical Provider, MD  ondansetron (ZOFRAN ODT) 4 MG disintegrating tablet Take 1 tablet (4 mg total) by mouth every 6 (six) hours as needed for nausea or vomiting. 08/26/16   Delman Kitten, MD  PARoxetine (PAXIL) 20 MG tablet Take 20  mg by mouth daily.    Historical Provider, MD  sildenafil (VIAGRA) 25 MG tablet Take 25 mg by mouth daily as needed for erectile dysfunction.    Historical Provider, MD    Allergies Review of patient's allergies indicates no known allergies.  Family History  Problem Relation Age of Onset  . Diabetes Mellitus II Mother   . Hip fracture Mother   . Colon cancer Father   . Lupus Sister   . Anxiety disorder Sister   . Bipolar disorder Sister   . Depression Sister   . Alcohol abuse Brother   . Lung cancer Brother   . Colon cancer Brother      Social History Social History  Substance Use Topics  . Smoking status: Current Every Day Smoker    Packs/day: 0.50    Years: 55.00    Types: Cigars  . Smokeless tobacco: Never Used  . Alcohol use 2.4 oz/week    2 Glasses of wine, 2 Shots of liquor per week    Review of Systems Constitutional: No fever/chills Eyes: No visual changes. ENT: No sore throat. Cardiovascular: Denies chest pain. Respiratory: Denies shortness of breath. Gastrointestinal: No abdominal pain.  No nausea, no vomiting.  No diarrhea.  No constipation. Genitourinary: Negative for dysuria. Musculoskeletal: Negative for back pain. Skin: Negative for rash. Neurological: Negative for headaches, focal weakness or numbness.  10-point ROS otherwise negative.  ____________________________________________   PHYSICAL EXAM:  VITAL SIGNS: ED Triage Vitals [08/26/16 1055]  Enc Vitals Group     BP 134/76     Pulse Rate 71     Resp 20     Temp 97.3 F (36.3 C)     Temp Source Oral     SpO2 96 %     Weight 205 lb (93 kg)     Height 6\' 2"  (1.88 m)     Head Circumference      Peak Flow      Pain Score 5     Pain Loc      Pain Edu?      Excl. in [place]?    Constitutional: Alert and oriented. Well appearing and in no acute distress. Eyes: Conjunctivae are normal. PERRL. EOMI. Head: Atraumatic. Nose: No congestion/rhinnorhea. Mouth/Throat: Mucous membranes are moist.  Oropharynx non-erythematous. Neck: No stridor.   Cardiovascular: Normal rate, regular rhythm. Grossly normal heart sounds.  Good peripheral circulation. Respiratory: Normal respiratory effort.  No retractions. Lungs CTAB.Modest tenderness to palpation along the right lower ribs. Gastrointestinal: Soft and nontender except for some mild tenderness in the right upper quadrant versus right lower rib cage. No distention. No abdominal bruits. No CVA tenderness. No crepitus Musculoskeletal: No lower extremity tenderness nor edema.  No joint  effusions. Neurologic:  Normal speech and language. No gross focal neurologic deficits are appreciated.  Skin:  Skin is warm, dry and intact. No rash noted. Psychiatric: Mood and affect are normal. Speech and behavior are normal.  ____________________________________________   LABS (all labs ordered are listed, but only abnormal results are displayed)  Labs Reviewed  COMPREHENSIVE METABOLIC PANEL - Abnormal; Notable for the following:       Result Value   Glucose, Bld 144 (*)    All other components within normal limits  CBC  TROPONIN I   ____________________________________________  EKG  Reviewed and interpreted by me at 1415 Heart rate 60 Wrist 90 QTc 440 Normal sinus rhythm, no evidence of ischemic change. ____________________________________________  RADIOLOGY  Ct Head Wo Contrast  Result  Date: 08/26/2016 CLINICAL DATA:  Patient status post fall on Thursday with positive loss of consciousness. Headache. Vomiting. EXAM: CT HEAD WITHOUT CONTRAST TECHNIQUE: Contiguous axial images were obtained from the base of the skull through the vertex without intravenous contrast. COMPARISON:  None. FINDINGS: Brain: Ventricles and sulci are prominent compatible with atrophy. Periventricular and subcortical white matter hypodensity compatible with chronic microvascular ischemic changes. No evidence for acute cortically based infarct, intracranial hemorrhage, mass lesion or mass-effect. Vascular: Unremarkable Skull: Intact. Sinuses/Orbits: Paranasal sinuses are well aerated. Mastoid air cells unremarkable. Visualized orbits are unremarkable. Other: None. IMPRESSION: No acute intracranial process. Chronic microvascular ischemic changes and cortical atrophy. Electronically Signed   By: Lovey Newcomer M.D.   On: 08/26/2016 11:30   Ct Chest W Contrast  Result Date: 08/26/2016 CLINICAL DATA:  Right lower chest pain, fell down on Thursday, back pain, right leg pain EXAM: CT CHEST, ABDOMEN, AND PELVIS  WITH CONTRAST TECHNIQUE: Multidetector CT imaging of the chest, abdomen and pelvis was performed following the standard protocol during bolus administration of intravenous contrast. CONTRAST:  183mL ISOVUE-300 IOPAMIDOL (ISOVUE-300) INJECTION 61% COMPARISON:  09/06/2010 FINDINGS: CT CHEST FINDINGS Cardiovascular: No aortic aneurysm or dissection. Atherosclerotic calcifications of thoracic aorta and coronary arteries. Heart size is within normal limits. No pericardial effusion. Mediastinum/Nodes: There is no mediastinal hematoma. Calcified mediastinal and right hilar lymph nodes are again noted. The central pulmonary artery is unremarkable. Lungs/Pleura: Images of the lung parenchyma shows bilateral emphysematous changes. There is bilateral peripheral honeycomb being and mild peripheral interstitial prominence suspicious for fibrotic changes. No segmental infiltrate or consolidation. Mild atelectasis noted bilateral lower lobe posteriorly. There is no pneumothorax. Musculoskeletal: No acute fractures are noted. Old appearing left posterior lower rib fractures. No clavicle fracture. No scapular fracture. Sagittal images of the spine shows mild osteopenia. Sagittal view of the sternum is unremarkable. CT ABDOMEN PELVIS FINDINGS Hepatobiliary: Enhanced liver is unremarkable. No evidence of liver laceration. No calcified gallstones are noted within gallbladder. Pancreas: Enhanced pancreas is unremarkable. Spleen: Enhanced spleen is unremarkable. Adrenals/Urinary Tract: No adrenal gland mass. Enhanced kidneys are symmetrical in size. No hydronephrosis or hydroureter. Delayed renal images shows bilateral renal symmetrical excretion. Bilateral visualized proximal ureter is unremarkable. The urinary bladder shows no evidence of acute injury. No bladder filling defects are noted. Stomach/Bowel: There is no gastric outlet obstruction. No small bowel obstruction. Some fluid noted within distal small bowel loops probable mild  ileus. No pericecal inflammation. Normal appendix is noted. Sun gas noted within cecum. Scattered diverticula are noted descending colon. Multiple sigmoid colon diverticula are noted. There is redundant sigmoid colon. No evidence of acute diverticulitis or colitis. Moderate gas noted within rectum. Vascular/Lymphatic: Atherosclerotic calcifications of abdominal aorta and iliac arteries are noted. There is aneurysmal dilatation of distal abdominal aorta measures 3.5 cm in diameter. No aortic dissection. No retroperitoneal or mesenteric adenopathy. Prostate gland and seminal vesicles are unremarkable. Reproductive: Prostate gland and seminal vesicles are unremarkable. Other: No ascites or free air.  No inguinal adenopathy. Musculoskeletal: No acute fractures are noted within abdomen or pelvis. No lower rib fractures are noted. Sagittal images of the spine shows mild disc space flattening at L5-S1 level. IMPRESSION: 1. There is no acute traumatic injury within chest. 2. No mediastinal hematoma or adenopathy. 3. Atherosclerotic calcifications of thoracic aorta and coronary arteries. 4. No lung contusion or pneumothorax. Bilateral emphysematous changes are noted. Peripheral fibrotic changes are noted bilaterally. 5. No acute visceral injury within abdomen and pelvis. 6. No hydronephrosis or hydroureter. 7.  Normal appendix.  No pericecal inflammation. 8. Distal colonic diverticulosis. No evidence of acute diverticulitis or colitis. Moderate gas noted within rectum. 9. Mild fluid distended distal small bowel loops probable mild ileus. No transition point in caliber of small bowel. 10. No acute fractures are noted within chest abdomen and pelvis. 11. There is aneurysmal dilatation of distal abdominal aorta measures 3.5 cm in diameter. No evidence of acute complication or periaortic leak. Electronically Signed   By: Lahoma Crocker M.D.   On: 08/26/2016 14:06   Ct Abdomen Pelvis W Contrast  Result Date: 08/26/2016 CLINICAL  DATA:  Right lower chest pain, fell down on Thursday, back pain, right leg pain EXAM: CT CHEST, ABDOMEN, AND PELVIS WITH CONTRAST TECHNIQUE: Multidetector CT imaging of the chest, abdomen and pelvis was performed following the standard protocol during bolus administration of intravenous contrast. CONTRAST:  14mL ISOVUE-300 IOPAMIDOL (ISOVUE-300) INJECTION 61% COMPARISON:  09/06/2010 FINDINGS: CT CHEST FINDINGS Cardiovascular: No aortic aneurysm or dissection. Atherosclerotic calcifications of thoracic aorta and coronary arteries. Heart size is within normal limits. No pericardial effusion. Mediastinum/Nodes: There is no mediastinal hematoma. Calcified mediastinal and right hilar lymph nodes are again noted. The central pulmonary artery is unremarkable. Lungs/Pleura: Images of the lung parenchyma shows bilateral emphysematous changes. There is bilateral peripheral honeycomb being and mild peripheral interstitial prominence suspicious for fibrotic changes. No segmental infiltrate or consolidation. Mild atelectasis noted bilateral lower lobe posteriorly. There is no pneumothorax. Musculoskeletal: No acute fractures are noted. Old appearing left posterior lower rib fractures. No clavicle fracture. No scapular fracture. Sagittal images of the spine shows mild osteopenia. Sagittal view of the sternum is unremarkable. CT ABDOMEN PELVIS FINDINGS Hepatobiliary: Enhanced liver is unremarkable. No evidence of liver laceration. No calcified gallstones are noted within gallbladder. Pancreas: Enhanced pancreas is unremarkable. Spleen: Enhanced spleen is unremarkable. Adrenals/Urinary Tract: No adrenal gland mass. Enhanced kidneys are symmetrical in size. No hydronephrosis or hydroureter. Delayed renal images shows bilateral renal symmetrical excretion. Bilateral visualized proximal ureter is unremarkable. The urinary bladder shows no evidence of acute injury. No bladder filling defects are noted. Stomach/Bowel: There is no  gastric outlet obstruction. No small bowel obstruction. Some fluid noted within distal small bowel loops probable mild ileus. No pericecal inflammation. Normal appendix is noted. Sun gas noted within cecum. Scattered diverticula are noted descending colon. Multiple sigmoid colon diverticula are noted. There is redundant sigmoid colon. No evidence of acute diverticulitis or colitis. Moderate gas noted within rectum. Vascular/Lymphatic: Atherosclerotic calcifications of abdominal aorta and iliac arteries are noted. There is aneurysmal dilatation of distal abdominal aorta measures 3.5 cm in diameter. No aortic dissection. No retroperitoneal or mesenteric adenopathy. Prostate gland and seminal vesicles are unremarkable. Reproductive: Prostate gland and seminal vesicles are unremarkable. Other: No ascites or free air.  No inguinal adenopathy. Musculoskeletal: No acute fractures are noted within abdomen or pelvis. No lower rib fractures are noted. Sagittal images of the spine shows mild disc space flattening at L5-S1 level. IMPRESSION: 1. There is no acute traumatic injury within chest. 2. No mediastinal hematoma or adenopathy. 3. Atherosclerotic calcifications of thoracic aorta and coronary arteries. 4. No lung contusion or pneumothorax. Bilateral emphysematous changes are noted. Peripheral fibrotic changes are noted bilaterally. 5. No acute visceral injury within abdomen and pelvis. 6. No hydronephrosis or hydroureter. 7. Normal appendix.  No pericecal inflammation. 8. Distal colonic diverticulosis. No evidence of acute diverticulitis or colitis. Moderate gas noted within rectum. 9. Mild fluid distended distal small bowel loops probable mild ileus. No transition point  in caliber of small bowel. 10. No acute fractures are noted within chest abdomen and pelvis. 11. There is aneurysmal dilatation of distal abdominal aorta measures 3.5 cm in diameter. No evidence of acute complication or periaortic leak. Electronically  Signed   By: Lahoma Crocker M.D.   On: 08/26/2016 14:06    ____________________________________________   PROCEDURES  Procedure(s) performed: None  Procedures  Critical Care performed: No  ____________________________________________   INITIAL IMPRESSION / ASSESSMENT AND PLAN / ED COURSE  Pertinent labs & imaging results that were available during my care of the patient were reviewed by me and considered in my medical decision making (see chart for details).  he reports ongoing pain over the right lower chest wall versus right upper abdomen after a fall. Hemodynamically stable, the fact that he is survived since Thursday after his traumatic injury is reassuring at this time, however given the extent of injury reported loss of consciousness with height we will obtain CT imaging to further evaluate for traumatic injuries.    Clinical Course    ----------------------------------------- 5:11 PM on 08/26/2016 -----------------------------------------  Patient reports he feels much better. There is a questionable ileus on CT which was discussed with Dr. Adonis Huguenin the general surgery, and the patient has been able to eat a sandwich as well as a couple cups of water and juice over the last hour without difficulty. He reports no further nausea, and is not having any abdominal pain. Discussed with general surgery, advised that as the patient is taking by mouth well no need for inpatient observation but rather good return precautions. I would agree with this and this was discussed with the patient, and he is agreeable with the plan to go home. Friend will be driving him, and careful return precautions advised. Patient very agreeable. ____________________________________________   FINAL CLINICAL IMPRESSION(S) / ED DIAGNOSES  Final diagnoses:  Chest wall contusion, right, initial encounter  Blunt trauma      NEW MEDICATIONS STARTED DURING THIS VISIT:  New Prescriptions    HYDROCODONE-ACETAMINOPHEN (NORCO/VICODIN) 5-325 MG TABLET    Take 1 tablet by mouth every 6 (six) hours as needed for moderate pain.   ONDANSETRON (ZOFRAN ODT) 4 MG DISINTEGRATING TABLET    Take 1 tablet (4 mg total) by mouth every 6 (six) hours as needed for nausea or vomiting.     Note:  This document was prepared using Dragon voice recognition software and may include unintentional dictation errors.     Delman Kitten, MD 08/26/16 1712  I will prescribe the patient a narcotic pain medicine due to their condition which I anticipate will cause at least moderate pain short term. I discussed with the patient safe use of narcotic pain medicines, and that they are not to drive, work in dangerous areas, or ever take more than prescribed (no more than 1 pill every 6 hours). We discussed that this is the type of medication that can be  overdosed on and the risks of this type of medicine. Patient is very agreeable to only use as prescribed and to never use more than prescribed.    Delman Kitten, MD 08/26/16 [phone]

## 2016-08-26 NOTE — ED Notes (Signed)

## 2016-08-26 NOTE — ED Triage Notes (Addendum)
Pt to ed with c/o fall down embankment on Thursday pm. Pt reports back pain, mild headache, vomiting yesterday after taking motrin, right leg pain, pt alert and oriented at this time. Denies loss of consciousness.

## 2016-08-26 NOTE — ED Notes (Signed)
Pt tolerated PO challenge well. No c/o n/v at this time. Pt states he feels "super!" and is wanting to go home.

## 2016-08-26 NOTE — ED Triage Notes (Signed)
Pt from [facility]. Fell 8 ft down embankment 2 days ago. Now has nausea and vomiting. On plavix and ASA

## 2018-02-11 IMAGING — CT CT ABD-PELV W/ CM
2 of 5 series · 12 of 36 positions shown, 15 images · IV contrast (iopamidol)
Comparison: 09/06/2010

CLINICAL DATA: Right lower chest pain, fell down on [REDACTED], back
pain, right leg pain

EXAM:
CT CHEST, ABDOMEN, AND PELVIS WITH CONTRAST
TECHNIQUE: Multidetector CT imaging of the chest, abdomen and pelvis was
performed following the standard protocol during bolus
administration of intravenous contrast.
CONTRAST:  100mL [id number] IOPAMIDOL ([id number]) INJECTION 61%

[Series 2: cap with · axial · 0.82mm/px · z∈[-1035,-475]mm · 9 of 141 slices shown, 12 images]
[im 15/141  mediastinal]
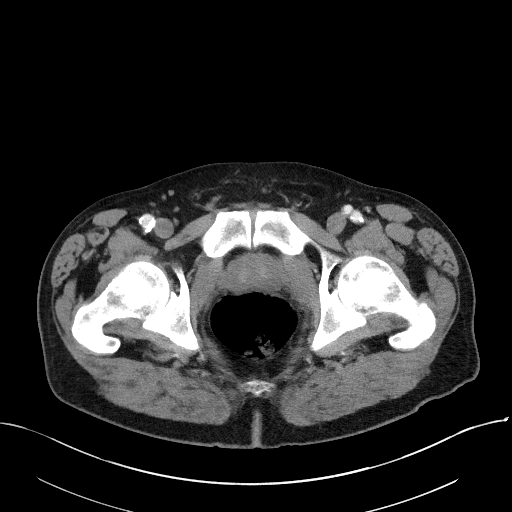
[im 15/141  lung]
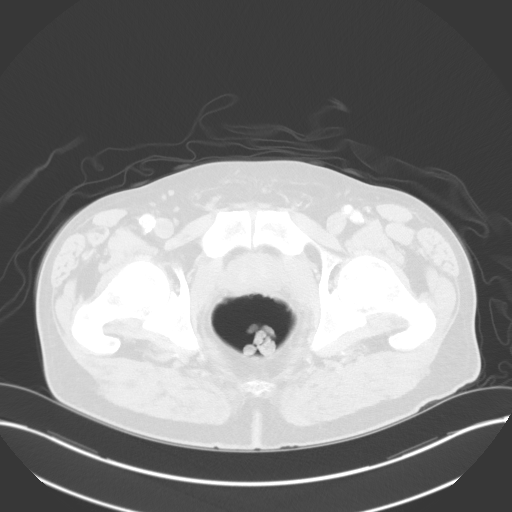
[im 29/141  lung]
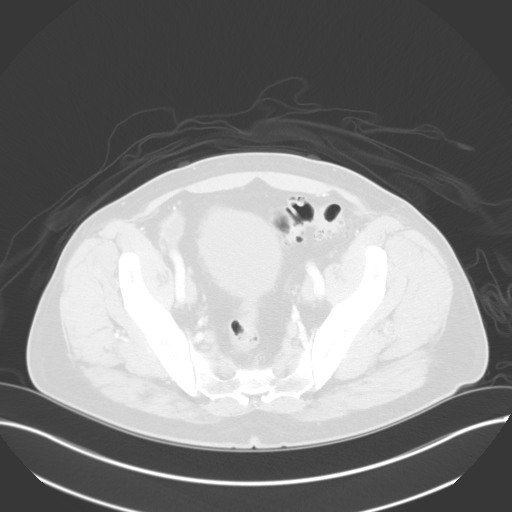
[im 43/141  lung]
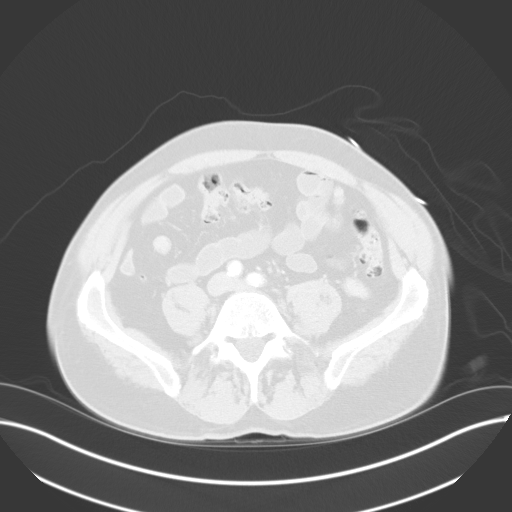
[im 57/141  lung]
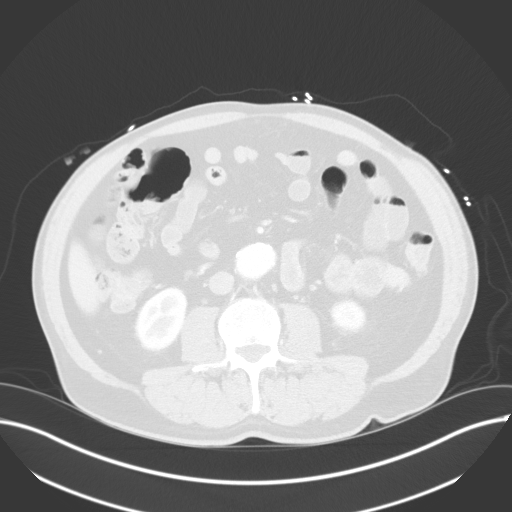
[im 71/141  mediastinal]
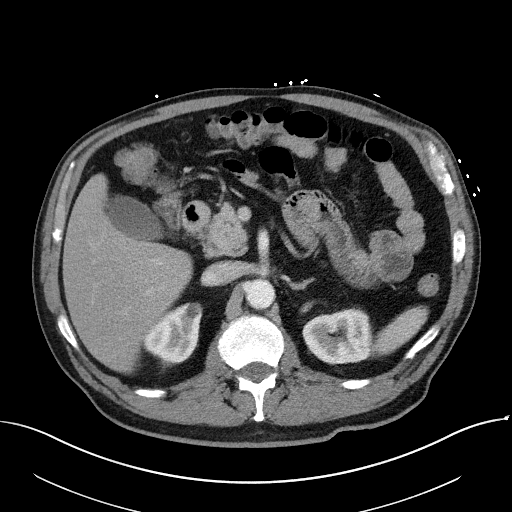
[im 71/141  lung]
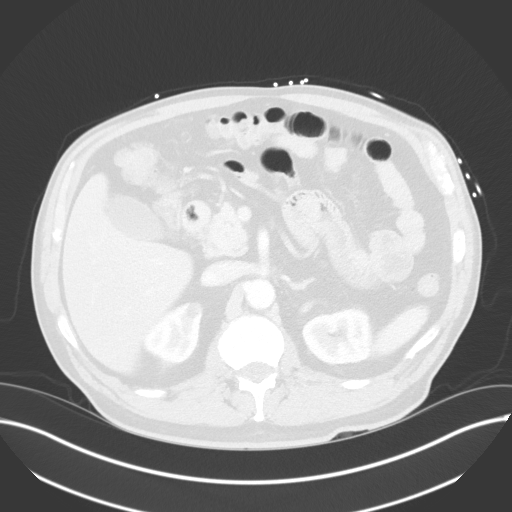
[im 85/141  lung]
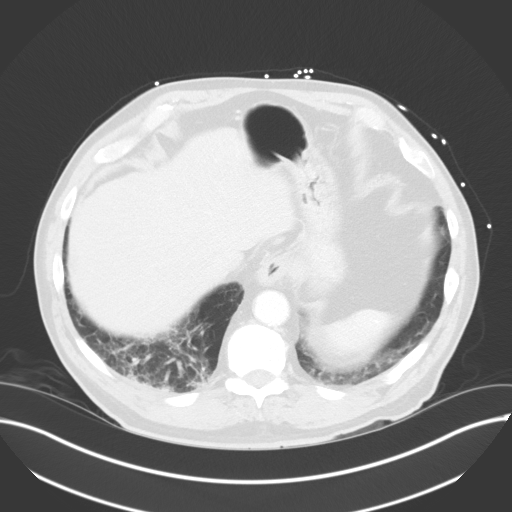
[im 99/141  lung]
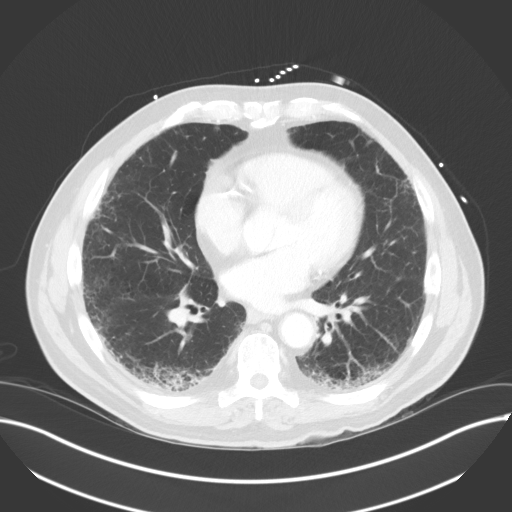
[im 113/141  lung]
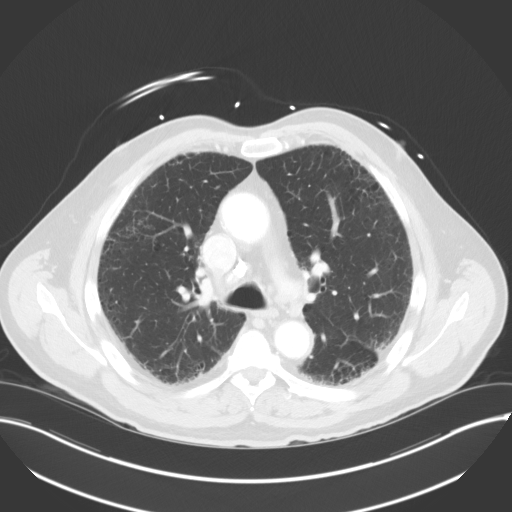
[im 127/141  mediastinal]
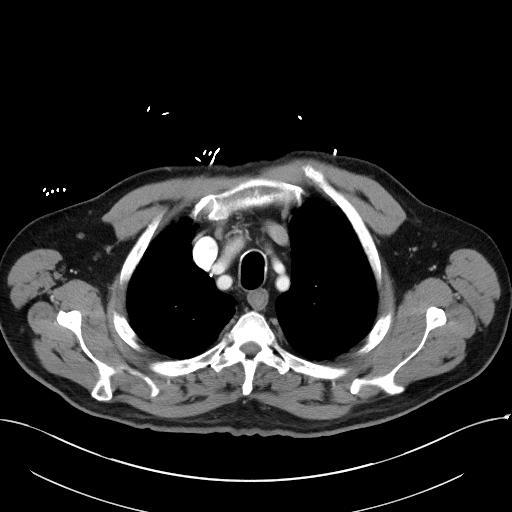
[im 127/141  lung]
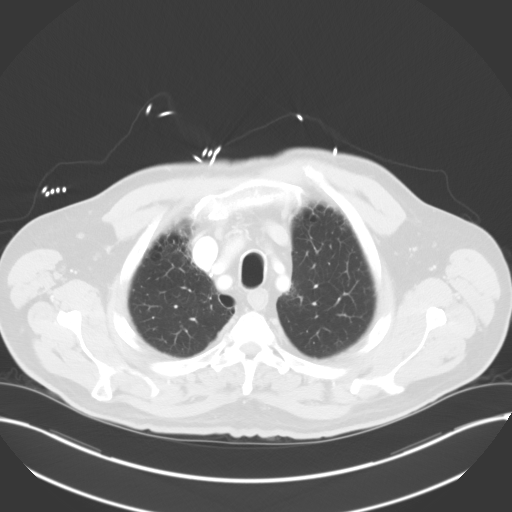

[Series 5: coronals · coronal · 0.76mm/px · 3 of 146 slices shown]
[im 30/146  lung]
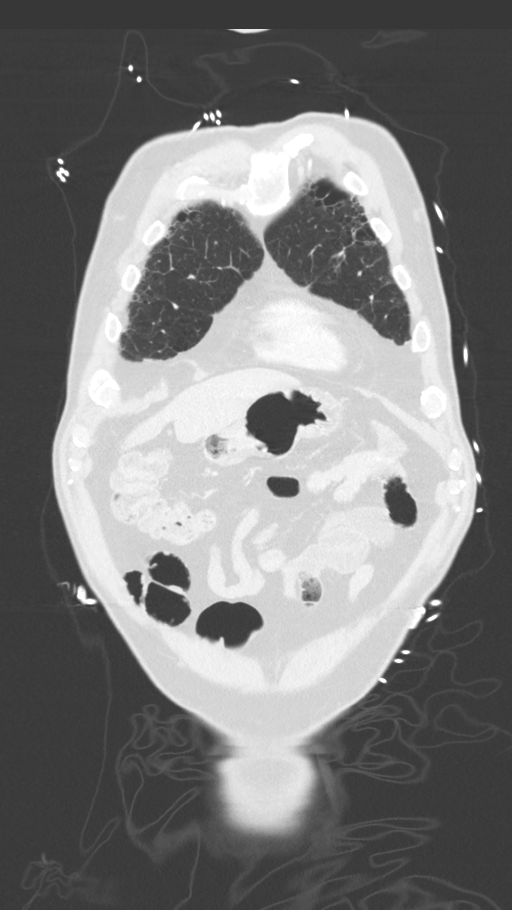
[im 59/146  lung]
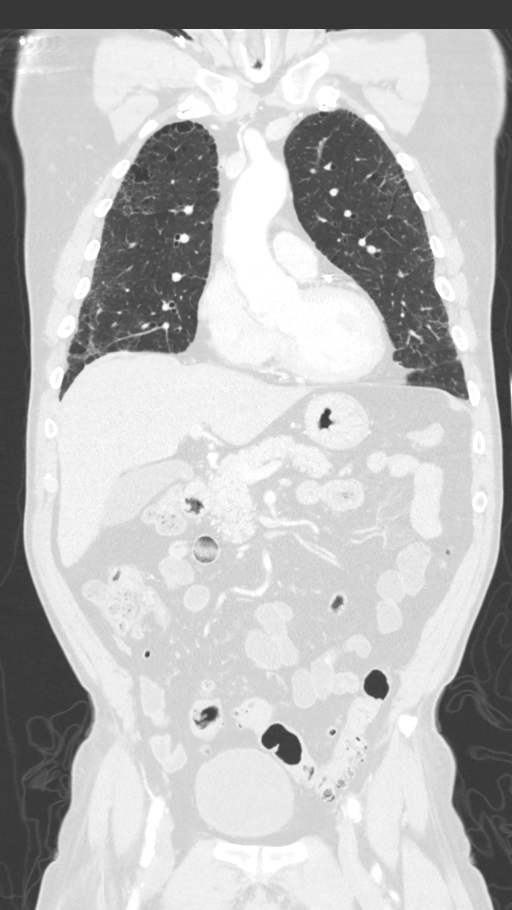
[im 88/146  lung]
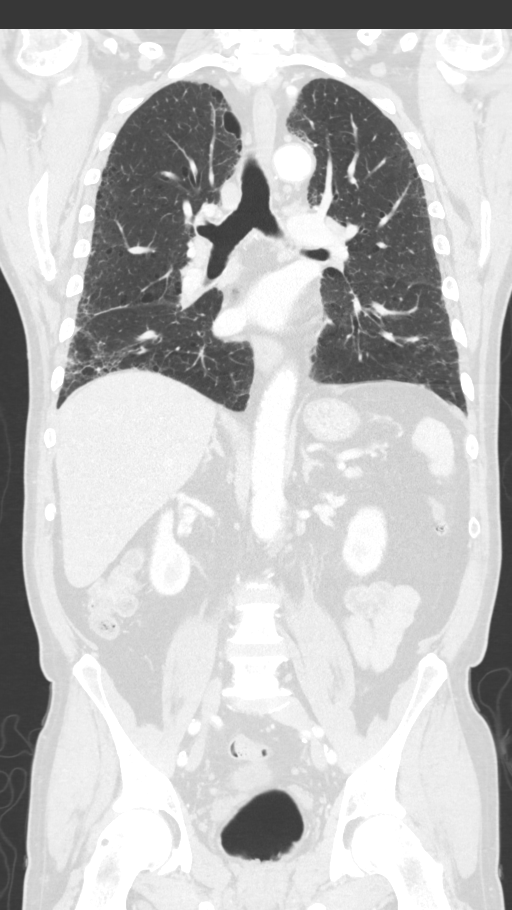

[12 of 36 positions shown; findings below may reference images not displayed]

FINDINGS: CT CHEST FINDINGS

Cardiovascular: No aortic aneurysm or dissection. Atherosclerotic
calcifications of thoracic aorta and coronary arteries. Heart size
is within normal limits. No pericardial effusion.

Mediastinum/Nodes: There is no mediastinal hematoma. Calcified
mediastinal and right hilar lymph nodes are again noted. The central
pulmonary artery is unremarkable.

Lungs/Pleura: Images of the lung parenchyma shows bilateral
emphysematous changes. There is bilateral peripheral honeycomb being
and mild peripheral interstitial prominence suspicious for fibrotic
changes. No segmental infiltrate or consolidation. Mild atelectasis
noted bilateral lower lobe posteriorly. There is no pneumothorax.

Musculoskeletal: No acute fractures are noted. Old appearing left
posterior lower rib fractures. No clavicle fracture. No scapular
fracture.

Sagittal images of the spine shows mild osteopenia. Sagittal view of
the sternum is unremarkable.

CT ABDOMEN PELVIS FINDINGS

Hepatobiliary: Enhanced liver is unremarkable. No evidence of liver
laceration. No calcified gallstones are noted within gallbladder.

Pancreas: Enhanced pancreas is unremarkable.

Spleen: Enhanced spleen is unremarkable.

Adrenals/Urinary Tract: No adrenal gland mass. Enhanced kidneys are
symmetrical in size. No hydronephrosis or hydroureter. Delayed renal
images shows bilateral renal symmetrical excretion. Bilateral
visualized proximal ureter is unremarkable. The urinary bladder
shows no evidence of acute injury. No bladder filling defects are
noted.

Stomach/Bowel: There is no gastric outlet obstruction. No small
bowel obstruction. Some fluid noted within distal small bowel loops
probable mild ileus. No pericecal inflammation. Normal appendix is
noted. Sun gas noted within cecum. Scattered diverticula are noted
descending colon. Multiple sigmoid colon diverticula are noted.
There is redundant sigmoid colon. No evidence of acute
diverticulitis or colitis. Moderate gas noted within rectum.

Vascular/Lymphatic: Atherosclerotic calcifications of abdominal
aorta and iliac arteries are noted. There is aneurysmal dilatation
of distal abdominal aorta measures 3.5 cm in diameter. No aortic
dissection. No retroperitoneal or mesenteric adenopathy. Prostate
gland and seminal vesicles are unremarkable.

Reproductive: Prostate gland and seminal vesicles are unremarkable.

Other: No ascites or free air.  No inguinal adenopathy.

Musculoskeletal: No acute fractures are noted within abdomen or
pelvis. No lower rib fractures are noted.

Sagittal images of the spine shows mild disc space flattening at
L5-S1 level.
IMPRESSION: 1. There is no acute traumatic injury within chest.
2. No mediastinal hematoma or adenopathy.
3. Atherosclerotic calcifications of thoracic aorta and coronary
arteries.
4. No lung contusion or pneumothorax. Bilateral emphysematous
changes are noted. Peripheral fibrotic changes are noted
bilaterally.
5. No acute visceral injury within abdomen and pelvis.
6. No hydronephrosis or hydroureter.
7. Normal appendix.  No pericecal inflammation.
8. Distal colonic diverticulosis. No evidence of acute
diverticulitis or colitis. Moderate gas noted within rectum.
9. Mild fluid distended distal small bowel loops probable mild
ileus. No transition point in caliber of small bowel.
10. No acute fractures are noted within chest abdomen and pelvis.
11. There is aneurysmal dilatation of distal abdominal aorta
measures 3.5 cm in diameter. No evidence of acute complication or
periaortic leak.

## 2019-10-15 ENCOUNTER — Ambulatory Visit: Payer: Self-pay | Admitting: Urology

## 2019-11-20 ENCOUNTER — Ambulatory Visit: Payer: Self-pay | Admitting: Urology

## 2019-11-21 ENCOUNTER — Encounter: Payer: Self-pay | Admitting: Urology

## 2024-01-19 DEATH — deceased
